# Patient Record
Sex: Female | Born: 1985 | Race: Black or African American | Hispanic: No | Marital: Married | Smoking: Never smoker
Health system: Southern US, Community
[De-identification: ages and names within clinical notes are randomized; demographics above are authoritative.]

## PROBLEM LIST (undated history)

## (undated) DIAGNOSIS — I10 Essential (primary) hypertension: Secondary | ICD-10-CM

## (undated) DIAGNOSIS — G43909 Migraine, unspecified, not intractable, without status migrainosus: Secondary | ICD-10-CM

## (undated) HISTORY — PX: CHOLECYSTECTOMY: SHX55

## (undated) HISTORY — PX: ESOPHAGOGASTRODUODENOSCOPY: SHX1529

## (undated) HISTORY — PX: FRACTURE SURGERY: SHX138

---

## 2011-12-16 ENCOUNTER — Ambulatory Visit: Payer: Self-pay

## 2015-09-02 ENCOUNTER — Ambulatory Visit: Payer: 59

## 2015-09-02 ENCOUNTER — Other Ambulatory Visit: Payer: Self-pay

## 2015-09-02 ENCOUNTER — Ambulatory Visit
Admission: EM | Admit: 2015-09-02 | Discharge: 2015-09-02 | Payer: 59 | Attending: Emergency Medicine | Admitting: Emergency Medicine

## 2015-09-02 DIAGNOSIS — R0602 Shortness of breath: Secondary | ICD-10-CM | POA: Diagnosis present

## 2015-09-02 DIAGNOSIS — R0600 Dyspnea, unspecified: Secondary | ICD-10-CM | POA: Diagnosis not present

## 2015-09-02 DIAGNOSIS — R079 Chest pain, unspecified: Secondary | ICD-10-CM | POA: Diagnosis present

## 2015-09-02 DIAGNOSIS — R042 Hemoptysis: Secondary | ICD-10-CM | POA: Diagnosis not present

## 2015-09-02 MED ORDER — SODIUM CHLORIDE 0.9 % IV BOLUS (SEPSIS): 1000.0000 mL | Freq: Once | INTRAVENOUS | Status: DC

## 2015-09-02 NOTE — ED Notes (Signed)
[facility] Just PTA reports started coughing.  Blood tinged sputum produced.

## 2015-09-02 NOTE — ED Provider Notes (Signed)
HPI  SUBJECTIVE:  Michaela Bernard is a 29 y.o. female who presents with chest pain described as heaviness starting approximately 15 minutes prior to arrival, coughing, wheezing, shortness of breath, multiple episodes of bright red hemoptysis while in clinic. She reports that her throat "feels full" and that it is hard to talk, but that her swallowing is okay. States that the voice changes started after having hemoptysis.  She denies chest pain radiating up to her neck or to her arm. She reports left leg cramping for the past 2 days. No calf swelling. No nausea, vomiting, diaphoresis.  Patient had an EGD 5 days ago under general anesthesia. No aggravating or alleviating factors. Symptoms started immediately prior to arrival. She has not tried anything for this. Earlier today, she just felt "drained, with low energy," came to clinic because she noticed some wheezing. All of her symptoms started approximately at 1920. She reports low blood pressure earlier today with a reading 73/53 followed by a reading of 112/65 immediately after. This was thought to be due to a machine malfunction. Past medical history of gestational hypertension, acid reflux. No history of PE, DVT, cancer, exogenous estrogen, tobacco use, coagulopathy. Family history significant for grandfather with MI at an unknown age.  Marland Kitchen No past medical history on file.  Past Surgical History  Procedure Laterality Date  . Cholecystectomy    . Esophagogastroduodenoscopy      No family history on file.  Social History  Substance Use Topics  . Smoking status: Never Smoker   . Smokeless tobacco: Not on file  . Alcohol Use: No     Current facility-administered medications:  .  sodium chloride 0.9 % bolus 1,000 mL, 1,000 mL, Intravenous, Once, Domenick Gong, MD No current outpatient prescriptions on file.  Allergies  Allergen Reactions  . Nsaids      ROS  As noted in HPI.   Physical Exam  BP 145/88 mmHg  Pulse 128   Temp(Src) 98.7 F (37.1 C) (Tympanic)  Resp 22  SpO2 97%  LMP 08/19/2015 (Approximate)  Constitutional: Well developed, well nourished, tachypneic speaking in short sentences only eyes: PERRL, EOMI, conjunctiva normal bilaterally HENT: Normocephalic, atraumatic,mucus membranes moist Respiratory: Clear to auscultation bilaterally, no rales, no wheezing, no rhonchi CardiovascuRegular tachycardia, no murmurs, no gallops, no rubs GI: Soft, nondistended, normal bowel sounds, nontender, no rebound, no guarding skin: No rash, skin intact Musculoskeletal: No edema, no tenderness, no deformities Neurologic: Alert & oriented x 3, CN II-XII grossly intact, no motor deficits, sensation grossly intact Psychiatric: Speech and behavior appropriate   ED Course   Medications  sodium chloride 0.9 % bolus 1,000 mL (not administered)    Orders Placed This Encounter  Procedures  . Insert peripheral IV    Standing Status: Standing     Number of Occurrences: 1     Standing Expiration Date:    No results found for this or any previous visit (from the past 24 hour(s)). No results found.  ED Clinical Impression  Hemoptysis  Dyspnea  ED Assessment/Plan  EKG: Normal sinus tachycardia, rate 127. Normal axis. Normal intervals. No hypertrophy. No ST-T wave changes. Isolated Q-wave in 3. No previous EKG for comparison.  Saw the patient immediately as soon as she was as triaged, heart rate in the 120s to 130s, satting 89-92% on room air, she is to speaking in short sentences, with a soft voice. There is no drooling there is no stridor.  she had multiple episodes of hemoptysis while in clinic.  Concern for PE, also concern for complication from recent EGD, pulmonary hemorrhage. Malignancy also the differential, but less likely. Doubt pneumonia or other infectious process. EKG with sinus tachycardia but no ischemic changes. Placed on supplemental oxygen with O2 sats improving 96% on room air. Patient states  that she feels somewhat better. IV and normal saline bolus started. Transferring to the ER via EMS   *This clinic note was created using Dragon dictation software. Therefore, there may be occasional mistakes despite careful proofreading.  ?  Domenick GongAshley Arn Mcomber, MD 09/02/15 2002

## 2015-09-02 NOTE — ED Notes (Signed)
EMS on scene prior to IV attempt/start.  Unable to administer ordered saline bolus.

## 2016-09-25 ENCOUNTER — Emergency Department
Admission: EM | Admit: 2016-09-25 | Discharge: 2016-09-25 | Disposition: A | Discharge status: Home / Self Care | Payer: 59 | Attending: Emergency Medicine | Admitting: Emergency Medicine

## 2016-09-25 ENCOUNTER — Emergency Department: Payer: 59

## 2016-09-25 DIAGNOSIS — R030 Elevated blood-pressure reading, without diagnosis of hypertension: Secondary | ICD-10-CM | POA: Diagnosis not present

## 2016-09-25 DIAGNOSIS — R0789 Other chest pain: Secondary | ICD-10-CM | POA: Insufficient documentation

## 2016-09-25 DIAGNOSIS — R072 Precordial pain: Secondary | ICD-10-CM | POA: Diagnosis present

## 2016-09-25 LAB — CBC
HEMATOCRIT: 37.7 % (ref 35.0–47.0)
Hemoglobin: 12.7 g/dL (ref 12.0–16.0)
MCH: 29.3 pg (ref 26.0–34.0)
MCHC: 33.7 g/dL (ref 32.0–36.0)
MCV: 86.8 fL (ref 80.0–100.0)
Platelets: 276 10*3/uL (ref 150–440)
RBC: 4.34 MIL/uL (ref 3.80–5.20)
RDW: 13.8 % (ref 11.5–14.5)
WBC: 6.8 10*3/uL (ref 3.6–11.0)

## 2016-09-25 LAB — BASIC METABOLIC PANEL
ANION GAP: 10 (ref 5–15)
BUN: 12 mg/dL (ref 6–20)
CO2: 22 mmol/L (ref 22–32)
Calcium: 9.4 mg/dL (ref 8.9–10.3)
Chloride: 106 mmol/L (ref 101–111)
Creatinine, Ser: 0.41 mg/dL — ABNORMAL LOW (ref 0.44–1.00)
Glucose, Bld: 83 mg/dL (ref 65–99)
POTASSIUM: 3.7 mmol/L (ref 3.5–5.1)
SODIUM: 138 mmol/L (ref 135–145)

## 2016-09-25 LAB — TROPONIN I: Troponin I: <0.03 ng/mL (ref <0.03)

## 2016-09-25 NOTE — ED Provider Notes (Signed)
[facility] Emergency Department Provider Note  ____________________________________________  Time seen: Approximately 4:50 PM  I have reviewed the triage vital signs and the nursing notes.   HISTORY  Chief Complaint Chest Pain    HPI Joneen BoersKrystle M Schopf is a 30 y.o. female sent to the ED from work due to acute onset of central chest pain over the sternum. Nonradiating. No aggravating or alleviating factors. Not exertional, not pleuritic. No nausea vomiting diaphoresis or shortness of breath. Never had pain like this before. Only medical history is of acid reflux for which she takes omeprazole.  No recent travel, trauma hospitalization or surgery. No exogenous hormone use. Does not smoke.     History reviewed. No pertinent past medical history.   There are no active problems to display for this patient.    Past Surgical History:  Procedure Laterality Date  . CHOLECYSTECTOMY    . ESOPHAGOGASTRODUODENOSCOPY    . FRACTURE SURGERY       Prior to Admission medications   Not on File     Allergies Nsaids   No family history on file.  Social History Social History  Substance Use Topics  . Smoking status: Never Smoker  . Smokeless tobacco: Never Used  . Alcohol use No    Review of Systems  Constitutional:   No fever or chills.  ENT:   No sore throat. No rhinorrhea. Cardiovascular:   Positive as above chest pain. Respiratory:   No dyspnea or cough. Gastrointestinal:   Negative for abdominal pain, vomiting and diarrhea.   10-point ROS otherwise negative.  ____________________________________________   PHYSICAL EXAM:  VITAL SIGNS: ED Triage Vitals [09/25/16 1240]  Enc Vitals Group     BP (!) 157/88     Pulse Rate 91     Resp 16     Temp 99.2 F (37.3 C)     Temp Source Oral     SpO2 100 %     Weight 196 lb (88.9 kg)     Height 5\' 2"  (1.575 m)     Head Circumference      Peak Flow      Pain Score 4     Pain Loc      Pain  Edu?      Excl. in GC?     Vital signs reviewed, nursing assessments reviewed.   Constitutional:   Alert and oriented. Well appearing and in no distress. Eyes:   No scleral icterus. No conjunctival pallor. PERRL. EOMI.  No nystagmus. ENT   Head:   Normocephalic and atraumatic.   Nose:   No congestion/rhinnorhea. No septal hematoma   Mouth/Throat:   MMM, no pharyngeal erythema. No peritonsillar mass.    Neck:   No stridor. No SubQ emphysema. No meningismus. Hematological/Lymphatic/Immunilogical:   No cervical lymphadenopathy. Cardiovascular:   RRR. Symmetric bilateral radial and DP pulses.  No murmurs.  Respiratory:   Normal respiratory effort without tachypnea nor retractions. Breath sounds are clear and equal bilaterally. No wheezes/rales/rhonchi. Gastrointestinal:   Soft with mild left upper quadrant and epigastric tenderness. Non distended. There is no CVA tenderness.  No rebound, rigidity, or guarding. Genitourinary:   deferred Musculoskeletal:   Nontender with normal range of motion in all extremities. No joint effusions.  No lower extremity tenderness.  No edema. Chest wall nontender Neurologic:   Normal speech and language.  CN 2-10 normal. Motor grossly intact. No gross focal neurologic deficits are appreciated.  Skin:    Skin is warm, dry and  intact. No rash noted.  No petechiae, purpura, or bullae.  ____________________________________________    LABS (pertinent positives/negatives) (all labs ordered are listed, but only abnormal results are displayed) Labs Reviewed  BASIC METABOLIC PANEL - Abnormal; Notable for the following:       Result Value   Creatinine, Ser 0.41 (*)    All other components within normal limits  CBC  TROPONIN I  TROPONIN I  TSH   ____________________________________________   EKG  Interpreted by me Normal sinus rhythm rate of 94, normal axis intervals QRS ST segments and T waves. No evidence of right heart  strain.  ____________________________________________    RADIOLOGY  Chest x-ray unremarkable  ____________________________________________   PROCEDURES Procedures  ____________________________________________   INITIAL IMPRESSION / ASSESSMENT AND PLAN / ED COURSE  Pertinent labs & imaging results that were available during my care of the patient were reviewed by me and considered in my medical decision making (see chart for details).  Patient well appearing no acute distress. Presents with atypical chest pain, likely gastritis. Initial EKG chest x-ray and labs all unremarkable. For further risk stratification. I recommended to the patient that we check a second troponin although she does not have significant risk factors for CAD and her heart score is low risk. She was agreeable to this but also had to leave immediately to pick up her 6013-month-old child. Therefore sent a sample for a repeat troponin, but then discharge the patient immediately. I will call her with the result at her request.  I had a long conversation with the patient and by telephone and her husband regarding her elevated blood pressure readings today. Given her symptoms and the degree of elevation, it does not warrant starting the patient immediately on antihypertensives. I did recommend close follow-up with primary care for recheck of blood pressure, and if it remains elevated she will likely need to start some medication at that time. Return precautions given.     Clinical Course    ____________________________________________   FINAL CLINICAL IMPRESSION(S) / ED DIAGNOSES  Final diagnoses:  Elevated blood pressure reading  Atypical chest pain       Portions of this note were generated with dragon dictation software. Dictation errors may occur despite best attempts at proofreading.    Sharman CheekPhillip Tearsa Kowalewski, MD 09/26/16 [phone]

## 2016-09-25 NOTE — Discharge Instructions (Signed)
Your blood pressure today was 158/100.  Please follow up with your doctor to have this rechecked and monitored.

## 2016-09-25 NOTE — ED Triage Notes (Signed)
Pt c/o chest pressure substernal that started around noon today with feeling clammy and nauseous..Marland Kitchen

## 2016-11-09 ENCOUNTER — Ambulatory Visit
Admission: EM | Admit: 2016-11-09 | Discharge: 2016-11-09 | Disposition: A | Discharge status: Home / Self Care | Payer: 59 | Attending: Family Medicine | Admitting: Family Medicine

## 2016-11-09 DIAGNOSIS — G43001 Migraine without aura, not intractable, with status migrainosus: Secondary | ICD-10-CM

## 2016-11-09 HISTORY — DX: Migraine, unspecified, not intractable, without status migrainosus: G43.909

## 2016-11-09 MED ORDER — KETOROLAC TROMETHAMINE 60 MG/2ML IM SOLN
60.0000 mg | Freq: Once | INTRAMUSCULAR | Status: AC
  Administered 2016-11-09: 60 mg via INTRAMUSCULAR

## 2016-11-09 NOTE — ED Triage Notes (Addendum)
Pt c/o headache since Christmas Day, she has tried Tylenol OTC but it isnt helping. She also took some Imitrex and that didn't help. Toradol does help, She mentions that she has severe reflux and says it causes her to have issues. Her BP is elevated today.

## 2016-11-09 NOTE — ED Provider Notes (Signed)
[facility]    CSN: [id number] Arrival date & time: 11/09/16  1958     History   Chief Complaint Chief Complaint  Patient presents with  . Headache    HPI Michaela Bernard is a 30 y.o. female.   Patient's here because of headache. She states this headache started on Monday as they and is just continued. Today she had 2 Imitrex tablets which she took the second one around noon and it did not help the headache. She has a history of migraines and headaches before. She is on atenolol and another medication to try to prevent her migraines. She reports having some nausea and while she was in.general having some shaking sensation. She states that she's had a shaking sensation for which she's had a headache especially the headache seemed to be persistent. She cannot state that this is the worse headache of her life but she states it is intense. She never had any type of imaging study of her head. As stated above she's been on multiple medications for migraines. Blood pressures elevated today and normally her blood pressures systolic states about 120 and taking the atenolol. She does not smoke. She's had cholecystectomy esophageal gastroduodenoscopy, and surgical repair fracture. He's never smoked and she is allergic to NSAIDs but she can take Toradol IM. She states that she has had nausea she has Phenergan at home which she'll take when she gets home.   The history is provided by the patient. No language interpreter was used.  Headache  Pain location:  R parietal Quality:  Sharp Radiates to:  Does not radiate Onset quality:  Unable to specify Duration:  5 days Timing:  Constant Progression:  Waxing and waning Chronicity:  New Similar to prior headaches: yes   Context: activity   Relieved by:  Nothing Worsened by:  Activity Ineffective treatments:  Acetaminophen and prescription medications Associated symptoms: nausea     Past Medical History:  Diagnosis Date  .  Migraines     There are no active problems to display for this patient.   Past Surgical History:  Procedure Laterality Date  . CHOLECYSTECTOMY    . ESOPHAGOGASTRODUODENOSCOPY    . FRACTURE SURGERY      OB History    No data available       Home Medications    Prior to Admission medications   Not on File    Family History No family history on file.  Social History Social History  Substance Use Topics  . Smoking status: Never Smoker  . Smokeless tobacco: Never Used  . Alcohol use No     Allergies   Nsaids   Review of Systems Review of Systems  Gastrointestinal: Positive for nausea.  Neurological: Positive for tremors and headaches.  All other systems reviewed and are negative.    Physical Exam Triage Vital Signs ED Triage Vitals  Enc Vitals Group     BP 11/09/16 2006 (!) 179/85     Pulse Rate 11/09/16 2006 100     Resp 11/09/16 2006 18     Temp 11/09/16 2006 98.4 F (36.9 C)     Temp Source 11/09/16 2006 Oral     SpO2 11/09/16 2006 100 %     Weight 11/09/16 2004 195 lb (88.5 kg)     Height 11/09/16 2004 5\' 2"  (1.575 m)     Head Circumference --      Peak Flow --      Pain Score 11/09/16 2006 9  Pain Loc --      Pain Edu? --      Excl. in GC? --    No data found.   Updated Vital Signs BP (!) 179/85 (BP Location: Left Arm)   Pulse 100   Temp 98.4 F (36.9 C) (Oral)   Resp 18   Ht 5\' 2"  (1.575 m)   Wt 195 lb (88.5 kg)   LMP 11/02/2016   SpO2 100%   BMI 35.67 kg/m   Visual Acuity Right Eye Distance:   Left Eye Distance:   Bilateral Distance:    Right Eye Near:   Left Eye Near:    Bilateral Near:     Physical Exam  Constitutional: She is oriented to person, place, and time. She appears well-developed and well-nourished.  HENT:  Head: Normocephalic and atraumatic.  Right Ear: External ear normal.  Left Ear: External ear normal.  Nose: Nose normal.  Mouth/Throat: Oropharynx is clear and moist.  Eyes: EOM are normal.  Pupils are equal, round, and reactive to light.  Neck: Normal range of motion. Neck supple. Thyromegaly present.  Cardiovascular: Normal rate and regular rhythm.   Pulmonary/Chest: Effort normal.  Musculoskeletal: Normal range of motion.  Neurological: She is alert and oriented to person, place, and time. No cranial nerve deficit. Coordination normal.  Skin: Skin is warm.  Psychiatric: She has a normal mood and affect. Her behavior is normal.  Vitals reviewed.    UC Treatments / Results  Labs (all labs ordered are listed, but only abnormal results are displayed) Labs Reviewed - No data to display  EKG  EKG Interpretation None       Radiology No results found.  Procedures Procedures (including critical care time)  Medications Ordered in UC Medications  ketorolac (TORADOL) injection 60 mg (60 mg Intramuscular Given 11/09/16 2054)     Initial Impression / Assessment and Plan / UC Course  I have reviewed the triage vital signs and the nursing notes.  Pertinent labs & imaging results that were available during my care of the patient were reviewed by me and considered in my medical decision making (see chart for details).  Clinical Course    Patient will be given 60 toradol IM per her request I'll also explained to her that it  is one thing to request treatment for her migraines since Toradol as worked before we'll plan to give her Toradol injection. However she was reevaluated for her migraines or she feels her migraines are different than they've been before and would probably need a CT scan or MRI on her head which she'll need to go to the emergency room to obtain. She is informing she was pretreated for her migraine will Mr. 160 Toradol. She states that she's had a history where he took to 3 days of Toradol injections finally clear her headache and she even inquire if we can give her a dose of Imitrex. Explained to her that after getting 2 doses of Imitrex today there is we  can give it to her is after her second dose which should be noon tomorrow we could give her subcutaneous checks does state she still having headache and another dose of Toradol. However if she is feels it headaches changing getting worse she needs to go to the ED of her choice to be evaluated for her headaches.  Final Clinical Impressions(s) / UC Diagnoses   Final diagnoses:  Migraine without aura and with status migrainosus, not intractable    New Prescriptions There  are no discharge medications for this patient.    Note: This dictation was prepared with Dragon dictation along with smaller phrase technology. Any transcriptional errors that result from this process are unintentional.   Hassan Rowan, MD 11/09/16 2109

## 2016-11-10 ENCOUNTER — Ambulatory Visit
Admission: EM | Admit: 2016-11-10 | Discharge: 2016-11-10 | Disposition: A | Discharge status: Home / Self Care | Payer: 59 | Attending: Family Medicine | Admitting: Family Medicine

## 2016-11-10 ENCOUNTER — Encounter: Payer: Self-pay | Admitting: Gynecology

## 2016-11-10 DIAGNOSIS — G43001 Migraine without aura, not intractable, with status migrainosus: Secondary | ICD-10-CM

## 2016-11-10 MED ORDER — SUMATRIPTAN SUCCINATE 6 MG/0.5ML ~~LOC~~ SOLN
6.0000 mg | Freq: Once | SUBCUTANEOUS | Status: AC
  Administered 2016-11-10: 6 mg via SUBCUTANEOUS

## 2016-11-10 MED ORDER — KETOROLAC TROMETHAMINE 60 MG/2ML IM SOLN
60.0000 mg | Freq: Once | INTRAMUSCULAR | Status: AC
  Administered 2016-11-10: 60 mg via INTRAMUSCULAR

## 2016-11-10 NOTE — ED Triage Notes (Signed)
Per patient was seen last pm and her migraine is not better.

## 2016-11-10 NOTE — ED Provider Notes (Signed)
[facility]    CSN: [id number] Arrival date & time: 11/10/16  1248     History   Chief Complaint Chief Complaint  Patient presents with  . Migraine    HPI Michaela Bernard is a 30 y.o. female.   Familiar with patient since she was seen last night less than 20 hours ago. That time we missed a shot of Toradol we did discussed that she has had to have multiple injections and repeat visits before in the past. She states that with the Toradol she was the headache went away she did sleep last night when the headache returned this morning. With returning of the headache she wanted to go ahead and be retreated and this time she is not taking the Imitrex today would like to have the subcutaneous Imitrex injections well. Please see yesterday's dictation for personal history on this patient   The history is provided by the patient.  Migraine  This is a recurrent problem. The current episode started 3 to 5 hours ago. The problem occurs every several days. The problem has been gradually improving. Associated symptoms include headaches. Pertinent negatives include no chest pain and no abdominal pain. Nothing aggravates the symptoms.    Past Medical History:  Diagnosis Date  . Migraines     There are no active problems to display for this patient.   Past Surgical History:  Procedure Laterality Date  . CHOLECYSTECTOMY    . ESOPHAGOGASTRODUODENOSCOPY    . FRACTURE SURGERY      OB History    No data available       Home Medications    Prior to Admission medications   Not on File    Family History No family history on file.  Social History Social History  Substance Use Topics  . Smoking status: Never Smoker  . Smokeless tobacco: Never Used  . Alcohol use No     Allergies   Nsaids   Review of Systems Review of Systems  Cardiovascular: Negative for chest pain.  Gastrointestinal: Negative for abdominal pain.  Neurological: Positive for headaches.  All  other systems reviewed and are negative.    Physical Exam Triage Vital Signs ED Triage Vitals  Enc Vitals Group     BP 11/10/16 1325 (!) 164/97     Pulse Rate 11/10/16 1325 (!) 102     Resp 11/10/16 1325 16     Temp 11/10/16 1325 98.7 F (37.1 C)     Temp Source 11/10/16 1323 Oral     SpO2 11/10/16 1325 100 %     Weight 11/10/16 1324 195 lb (88.5 kg)     Height 11/10/16 1324 5\' 2"  (1.575 m)     Head Circumference --      Peak Flow --      Pain Score 11/10/16 1326 6     Pain Loc --      Pain Edu? --      Excl. in GC? --    No data found.   Updated Vital Signs BP (!) 164/97 (BP Location: Left Arm)   Pulse (!) 102   Temp 98.7 F (37.1 C) (Oral)   Resp 16   Ht 5\' 2"  (1.575 m)   Wt 195 lb (88.5 kg)   LMP 11/02/2016   SpO2 100%   BMI 35.67 kg/m   Visual Acuity Right Eye Distance:   Left Eye Distance:   Bilateral Distance:    Right Eye Near:   Left Eye Near:  Bilateral Near:     Physical Exam  Constitutional: She is oriented to person, place, and time. She appears well-developed and well-nourished.  HENT:  Head: Normocephalic and atraumatic.  Right Ear: External ear normal.  Left Ear: External ear normal.  Eyes: Pupils are equal, round, and reactive to light.  Neck: Normal range of motion.  Pulmonary/Chest: Effort normal.  Musculoskeletal: Normal range of motion.  Neurological: She is alert and oriented to person, place, and time.  Skin: Skin is warm and dry.  Psychiatric: She has a normal mood and affect.  Vitals reviewed.    UC Treatments / Results  Labs (all labs ordered are listed, but only abnormal results are displayed) Labs Reviewed - No data to display  EKG  EKG Interpretation None       Radiology No results found.  Procedures Procedures (including critical care time)  Medications Ordered in UC Medications  SUMAtriptan (IMITREX) injection 6 mg (not administered)  ketorolac (TORADOL) injection 60 mg (not administered)      Initial Impression / Assessment and Plan / UC Course  I have reviewed the triage vital signs and the nursing notes.  Pertinent labs & imaging results that were available during my care of the patient were reviewed by me and considered in my medical decision making (see chart for details).  Clinical Course     Will Mr. 2160 Toradol IM and Imitrex subcutaneous. Turns out she may be getting Imitrex 25 mg recommend she talk to her doctor about 2500 mg Imitrex in the future  Final Clinical Impressions(s) / UC Diagnoses   Final diagnoses:  Migraine without aura and with status migrainosus, not intractable    New Prescriptions New Prescriptions   No medications on file    Note: This dictation was prepared with Dragon dictation along with smaller phrase technology. Any transcriptional errors that result from this process are unintentional.   Hassan RowanEugene Rilee Knoll, MD 11/10/16 [phone]

## 2017-03-14 ENCOUNTER — Other Ambulatory Visit: Payer: Self-pay | Admitting: Internal Medicine

## 2017-03-14 DIAGNOSIS — E0590 Thyrotoxicosis, unspecified without thyrotoxic crisis or storm: Secondary | ICD-10-CM

## 2018-01-21 ENCOUNTER — Encounter: Payer: Self-pay | Admitting: Emergency Medicine

## 2018-01-21 ENCOUNTER — Ambulatory Visit
Admission: EM | Admit: 2018-01-21 | Discharge: 2018-01-21 | Disposition: A | Discharge status: Home / Self Care | Payer: 59 | Attending: Family Medicine | Admitting: Family Medicine

## 2018-01-21 ENCOUNTER — Other Ambulatory Visit: Payer: Self-pay

## 2018-01-21 DIAGNOSIS — L089 Local infection of the skin and subcutaneous tissue, unspecified: Secondary | ICD-10-CM

## 2018-01-21 HISTORY — DX: Essential (primary) hypertension: I10

## 2018-01-21 MED ORDER — MUPIROCIN 2 % EX OINT: 1.0000 "application " | TOPICAL_OINTMENT | Freq: Two times a day (BID) | CUTANEOUS | 0 refills | Status: AC

## 2018-01-21 NOTE — Discharge Instructions (Signed)
Warm compresses 3-4 times daily.  Topical antibiotic ointment as prescribed.  Take care  Dr. Adriana Simasook

## 2018-01-21 NOTE — ED Triage Notes (Signed)
Patient c/o tender bump above her pubic area about a hour ago.

## 2018-01-21 NOTE — ED Provider Notes (Signed)
[facility]  CSN: [id number] Arrival date & time: 01/21/18  1958  History   Chief Complaint Chief Complaint  Patient presents with  . Abscess   HPI  32 year old female presents with concern for abscess.  Patient states that approximately 30 minutes ago she noticed.  She states that she has a tender area above her pubic bone.  No drainage.  No fevers or chills. No medications or treatments tried. No other associated symptoms. No other complaints at this time.  Past Medical History:  Diagnosis Date  . Hypertension   . Migraines    Past Surgical History:  Procedure Laterality Date  . CHOLECYSTECTOMY    . ESOPHAGOGASTRODUODENOSCOPY    . FRACTURE SURGERY     OB History    No data available     Home Medications    Prior to Admission medications   Medication Sig Start Date End Date Taking? Authorizing Provider  atenolol (TENORMIN) 25 MG tablet Take 25 mg by mouth daily.   Yes [provider]  omeprazole (PRILOSEC) 40 MG capsule Take 40 mg by mouth daily.   Yes [provider]  traZODone (DESYREL) 25 mg TABS tablet Take 25 mg by mouth at bedtime.   Yes [provider]  mupirocin ointment (BACTROBAN) 2 % Apply 1 application topically 2 (two) times daily. 01/21/18   Tommie Sams, DO   Family History History reviewed. No pertinent family history.  Social History Social History   Tobacco Use  . Smoking status: Never Smoker  . Smokeless tobacco: Never Used  Substance Use Topics  . Alcohol use: No  . Drug use: No   Allergies   Nsaids  Review of Systems Review of Systems  Constitutional: Negative.   Skin:       Bump; concern for abscess.   Physical Exam Triage Vital Signs ED Triage Vitals  Enc Vitals Group     BP 01/21/18 2007 138/72     Pulse Rate 01/21/18 2007 79     Resp 01/21/18 2007 16     Temp 01/21/18 2007 98.2 F (36.8 C)     Temp Source 01/21/18 2007 Oral     SpO2 01/21/18 2007 100 %     Weight 01/21/18 2004 197  lb (89.4 kg)     Height 01/21/18 2004 5\' 2"  (1.575 m)     Head Circumference --      Peak Flow --      Pain Score 01/21/18 2004 5     Pain Loc --      Pain Edu? --      Excl. in GC? --    Orthostatic VS for the past 24 hrs:  BP- Lying Pulse- Lying BP- Sitting Pulse- Sitting BP- Standing at 0 minutes Pulse- Standing at 0 minutes  01/21/18 2011 134/82 89 125/79 89 108/82 93    Updated Vital Signs BP 138/72 (BP Location: Left Arm)   Pulse 79   Temp 98.2 F (36.8 C) (Oral)   Resp 16   Ht 5\' 2"  (1.575 m)   Wt 197 lb (89.4 kg)   LMP 01/07/2018 (Approximate)   SpO2 100%   BMI 36.03 kg/m   Visual Acuity Right Eye Distance:   Left Eye Distance:   Bilateral Distance:    Right Eye Near:   Left Eye Near:    Bilateral Near:     Physical Exam  Constitutional: She is oriented to person, place, and time. She appears well-developed. No distress.  HENT:  Head: Normocephalic and atraumatic.  Pulmonary/Chest: Effort normal. No respiratory distress.  Neurological: She is oriented to person, place, and time.  Skin:  Small pustule noted in the suprapubic region.  No surrounding erythema.  Tender to palpation.  Psychiatric: She has a normal mood and affect. Her behavior is normal.  Nursing note and vitals reviewed.  UC Treatments / Results  Labs (all labs ordered are listed, but only abnormal results are displayed) Labs Reviewed - No data to display  EKG  EKG Interpretation None       Radiology No results found.  Procedures Procedures (including critical care time)  Medications Ordered in UC Medications - No data to display   Initial Impression / Assessment and Plan / UC Course  I have reviewed the triage vital signs and the nursing notes.  Pertinent labs & imaging results that were available during my care of the patient were reviewed by me and considered in my medical decision making (see chart for details).     32 year old female presents with a pustule.  Treating with Bactroban. Advised warm compresses.  Final Clinical Impressions(s) / UC Diagnoses   Final diagnoses:  Pustule    ED Discharge Orders        Ordered    mupirocin ointment (BACTROBAN) 2 %  2 times daily     01/21/18 2012     Controlled Substance Prescriptions [place] Controlled Substance Registry consulted? Not Applicable   Tommie SamsCook, Latice Waitman G, DO 01/21/18 2050

## 2018-03-27 IMAGING — CR DG CHEST 2V
2 series · 2 of 2 positions shown · non-contrast
Comparison: None.

CLINICAL DATA: Pt having chest pains today. Hx of bleeding in lungs
and was admitted at [HOSPITAL] for it in August 2015 and February 2016. Hx of
hypertension and breast augmentation. Non smoker

EXAM:
CHEST  2 VIEW

[chest pa]
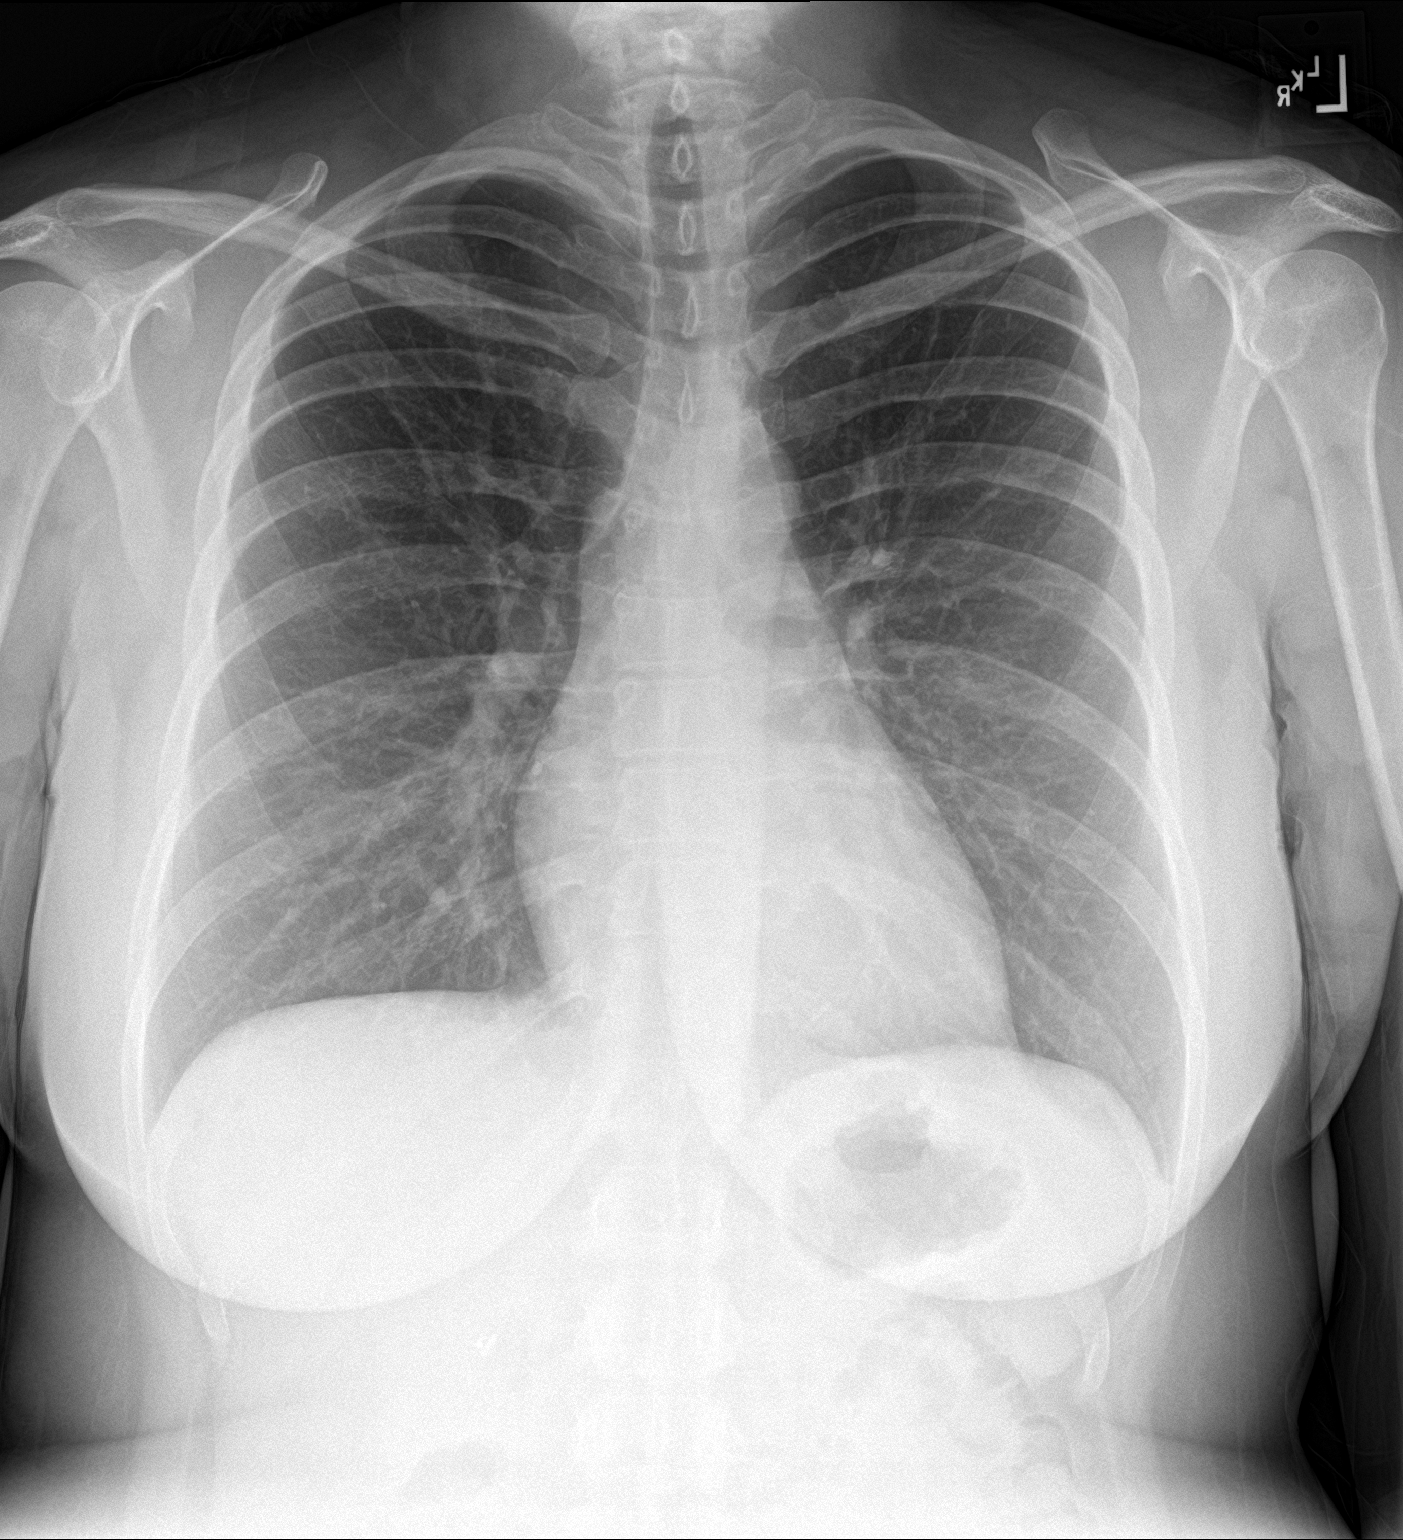

[chest lat]
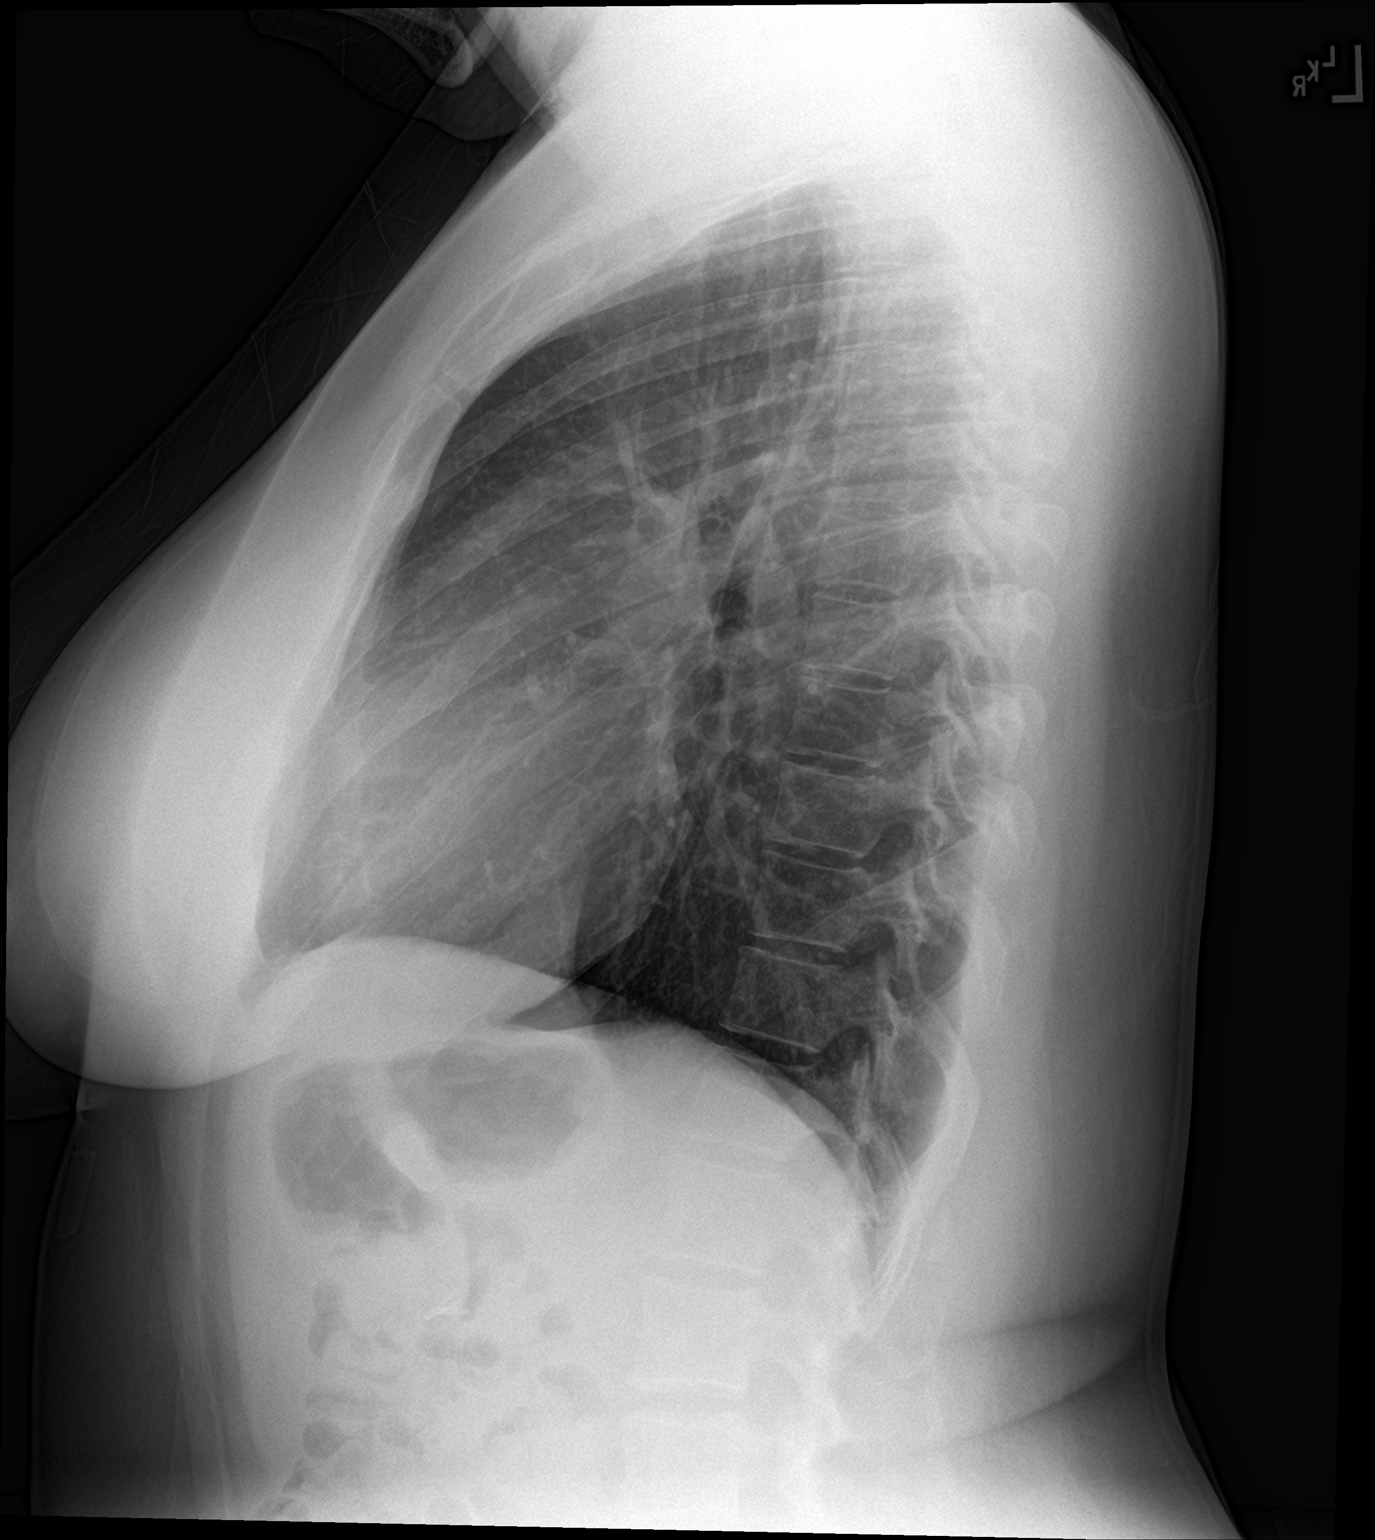

[2 of 2 positions shown; findings below may reference images not displayed]

FINDINGS: Bilateral saline breast implants. Mild right hemidiaphragm
elevation. Cholecystectomy. Midline trachea. Normal heart size and
mediastinal contours. No pleural effusion or pneumothorax. Clear
lungs.
IMPRESSION: No acute cardiopulmonary disease.

## 2020-06-11 ENCOUNTER — Ambulatory Visit: Payer: Self-pay | Attending: Internal Medicine

## 2020-06-11 DIAGNOSIS — Z23 Encounter for immunization: Secondary | ICD-10-CM

## 2020-06-11 NOTE — Progress Notes (Signed)
   Covid-19 Vaccination Clinic  Name:  Michaela Bernard    MRN: [id number] DOB: April 27, 1986  06/11/2020  [name] was observed post Covid-19 immunization for 15 minutes without incident. She was provided with Vaccine Information Sheet and instruction to access the V-Safe system.   [name] was instructed to call 911 with any severe reactions post vaccine: Marland Kitchen Difficulty breathing  . Swelling of face and throat  . A fast heartbeat  . A bad rash all over body  . Dizziness and weakness   Immunizations Administered    Name Date Dose VIS Date Route   Pfizer COVID-19 Vaccine 06/11/2020 11:30 AM 0.3 mL 01/06/2019 Intramuscular   Manufacturer: [employer], [employer]   Lot: [id number]   NDC: [address]-9

## 2024-11-26 ENCOUNTER — Encounter: Payer: Self-pay | Admitting: Family Medicine
